# Patient Record
Sex: Female | Born: 1965 | Hispanic: Refuse to answer | Smoking: Current every day smoker
Health system: Southern US, Community
[De-identification: ages and names within clinical notes are randomized; demographics above are authoritative.]

---

## 2011-05-03 ENCOUNTER — Ambulatory Visit (INDEPENDENT_AMBULATORY_CARE_PROVIDER_SITE_OTHER): Payer: PRIVATE HEALTH INSURANCE

## 2011-05-03 DIAGNOSIS — F17200 Nicotine dependence, unspecified, uncomplicated: Secondary | ICD-10-CM

## 2011-05-03 DIAGNOSIS — E669 Obesity, unspecified: Secondary | ICD-10-CM

## 2011-05-03 DIAGNOSIS — M546 Pain in thoracic spine: Secondary | ICD-10-CM

## 2011-05-03 DIAGNOSIS — Z0000 Encounter for general adult medical examination without abnormal findings: Secondary | ICD-10-CM

## 2011-05-29 ENCOUNTER — Ambulatory Visit (INDEPENDENT_AMBULATORY_CARE_PROVIDER_SITE_OTHER): Payer: PRIVATE HEALTH INSURANCE | Admitting: Physician Assistant

## 2011-05-29 DIAGNOSIS — N912 Amenorrhea, unspecified: Secondary | ICD-10-CM

## 2011-05-29 DIAGNOSIS — F17200 Nicotine dependence, unspecified, uncomplicated: Secondary | ICD-10-CM

## 2011-05-29 DIAGNOSIS — R062 Wheezing: Secondary | ICD-10-CM

## 2011-05-29 DIAGNOSIS — R404 Transient alteration of awareness: Secondary | ICD-10-CM

## 2011-05-29 DIAGNOSIS — R5383 Other fatigue: Secondary | ICD-10-CM

## 2011-06-05 ENCOUNTER — Other Ambulatory Visit: Payer: Self-pay | Admitting: Otolaryngology

## 2011-06-05 DIAGNOSIS — J329 Chronic sinusitis, unspecified: Secondary | ICD-10-CM

## 2011-06-11 ENCOUNTER — Other Ambulatory Visit: Payer: Self-pay

## 2013-09-12 ENCOUNTER — Encounter (HOSPITAL_COMMUNITY): Payer: Self-pay | Admitting: Emergency Medicine

## 2013-09-12 DIAGNOSIS — F17200 Nicotine dependence, unspecified, uncomplicated: Secondary | ICD-10-CM | POA: Insufficient documentation

## 2013-09-12 DIAGNOSIS — Z7982 Long term (current) use of aspirin: Secondary | ICD-10-CM | POA: Diagnosis not present

## 2013-09-12 DIAGNOSIS — G44209 Tension-type headache, unspecified, not intractable: Secondary | ICD-10-CM | POA: Insufficient documentation

## 2013-09-12 DIAGNOSIS — Z791 Long term (current) use of non-steroidal anti-inflammatories (NSAID): Secondary | ICD-10-CM | POA: Insufficient documentation

## 2013-09-12 DIAGNOSIS — R51 Headache: Secondary | ICD-10-CM | POA: Diagnosis present

## 2013-09-12 MED ORDER — OXYCODONE-ACETAMINOPHEN 5-325 MG PO TABS
1.0000 | ORAL_TABLET | Freq: Once | ORAL | Status: AC
Start: 2013-09-12 — End: 2013-09-12
  Administered 2013-09-12: 1 via ORAL
  Filled 2013-09-12: qty 1

## 2013-09-12 NOTE — ED Notes (Addendum)
C/o headache x 3-4 days with nausea and light sensitivity.  Taking OTC meds without relief.  No neuro deficits noted on triage exam.

## 2013-09-13 ENCOUNTER — Emergency Department (HOSPITAL_COMMUNITY)
Admission: EM | Admit: 2013-09-13 | Discharge: 2013-09-13 | Disposition: A | Discharge status: Home / Self Care | Payer: 59 | Attending: Emergency Medicine | Admitting: Emergency Medicine

## 2013-09-13 DIAGNOSIS — G44209 Tension-type headache, unspecified, not intractable: Secondary | ICD-10-CM

## 2013-09-13 MED ORDER — OXYCODONE-ACETAMINOPHEN 5-325 MG PO TABS
1.0000 | ORAL_TABLET | Freq: Once | ORAL | Status: AC
  Administered 2013-09-13: 1 via ORAL
  Filled 2013-09-13: qty 1

## 2013-09-13 MED ORDER — SODIUM CHLORIDE 0.9 % IV SOLN
1000.0000 mL | INTRAVENOUS | Status: DC
  Administered 2013-09-13: 1000 mL via INTRAVENOUS

## 2013-09-13 MED ORDER — DEXAMETHASONE SODIUM PHOSPHATE 10 MG/ML IJ SOLN
10.0000 mg | Freq: Once | INTRAMUSCULAR | Status: AC
  Administered 2013-09-13: 10 mg via INTRAVENOUS
  Filled 2013-09-13: qty 1

## 2013-09-13 MED ORDER — METOCLOPRAMIDE HCL 5 MG/ML IJ SOLN
10.0000 mg | Freq: Once | INTRAMUSCULAR | Status: AC
  Administered 2013-09-13: 10 mg via INTRAVENOUS
  Filled 2013-09-13: qty 2

## 2013-09-13 MED ORDER — DIPHENHYDRAMINE HCL 50 MG/ML IJ SOLN
25.0000 mg | Freq: Once | INTRAMUSCULAR | Status: AC
  Administered 2013-09-13: 25 mg via INTRAVENOUS
  Filled 2013-09-13: qty 1

## 2013-09-13 MED ORDER — SODIUM CHLORIDE 0.9 % IV SOLN
1000.0000 mL | Freq: Once | INTRAVENOUS | Status: AC
  Administered 2013-09-13: 1000 mL via INTRAVENOUS

## 2013-09-13 MED ORDER — METOCLOPRAMIDE HCL 10 MG PO TABS: 10.0000 mg | ORAL_TABLET | Freq: Four times a day (QID) | ORAL | Status: AC | PRN

## 2013-09-13 NOTE — ED Notes (Signed)
Pt states headache for 3 days that is made worse with light.  Pt states OTC medicaitons are not helping "at all"

## 2013-09-13 NOTE — Discharge Instructions (Signed)
Tension Headache A tension headache is a feeling of pain, pressure, or aching often felt over the front and sides of the head. The pain can be dull or can feel tight (constricting). It is the most common type of headache. Tension headaches are not normally associated with nausea or vomiting and do not get worse with physical activity. Tension headaches can last 30 minutes to several days.  CAUSES  The exact cause is not known, but it may be caused by chemicals and hormones in the brain that lead to pain. Tension headaches often begin after stress, anxiety, or depression. Other triggers may include:  Alcohol.  Caffeine (too much or withdrawal).  Respiratory infections (colds, flu, sinus infections).  Dental problems or teeth clenching.  Fatigue.  Holding your head and neck in one position too long while using a computer. SYMPTOMS   Pressure around the head.   Dull, aching head pain.   Pain felt over the front and sides of the head.   Tenderness in the muscles of the head, neck, and shoulders. DIAGNOSIS  A tension headache is often diagnosed based on:   Symptoms.   Physical examination.   A CT scan or MRI of your head. These tests may be ordered if symptoms are severe or unusual. TREATMENT  Medicines may be given to help relieve symptoms.  HOME CARE INSTRUCTIONS   Only take over-the-counter or prescription medicines for pain or discomfort as directed by your caregiver.   Lie down in a dark, quiet room when you have a headache.   Keep a journal to find out what may be triggering your headaches. For example, write down:  What you eat and drink.  How much sleep you get.  Any change to your diet or medicines.  Try massage or other relaxation techniques.   Ice packs or heat applied to the head and neck can be used. Use these 3 to 4 times per day for 15 to 20 minutes each time, or as needed.   Limit stress.   Sit up straight, and do not tense your muscles.    Quit smoking if you smoke.  Limit alcohol use.  Decrease the amount of caffeine you drink, or stop drinking caffeine.  Eat and exercise regularly.  Get 7 to 9 hours of sleep, or as recommended by your caregiver.  Avoid excessive use of pain medicine as recurrent headaches can occur.  SEEK MEDICAL CARE IF:   You have problems with the medicines you were prescribed.  Your medicines do not work.  You have a change from the usual headache.  You have nausea or vomiting. SEEK IMMEDIATE MEDICAL CARE IF:   Your headache becomes severe.  You have a fever.  You have a stiff neck.  You have loss of vision.  You have muscular weakness or loss of muscle control.  You lose your balance or have trouble walking.  You feel faint or pass out.  You have severe symptoms that are different from your first symptoms. MAKE SURE YOU:   Understand these instructions.  Will watch your condition.  Will get help right away if you are not doing well or get worse. Document Released: 04/22/2005 Document Revised: 07/15/2011 Document Reviewed: 04/12/2011 [facility] Patient Information 2014 [place], [place].  Metoclopramide tablets What is this medicine? METOCLOPRAMIDE (met oh kloe PRA mide) is used to treat the symptoms of gastroesophageal reflux disease (GERD) like heartburn. It is also used to treat people with slow emptying of the stomach and intestinal tract.  This medicine may be used for other purposes; ask your health care provider or pharmacist if you have questions. COMMON BRAND NAME(S): Reglan What should I tell my health care provider before I take this medicine? They need to know if you have any of these conditions: -breast cancer -depression -diabetes -heart failure -high blood pressure -kidney disease -liver disease -Parkinson's disease or a movement disorder -pheochromocytoma -seizures -stomach obstruction, bleeding, or perforation -an unusual or allergic reaction to  metoclopramide, procainamide, sulfites, other medicines, foods, dyes, or preservatives -pregnant or trying to get pregnant -breast-feeding How should I use this medicine? Take this medicine by mouth with a glass of water. Follow the directions on the prescription label. Take this medicine on an empty stomach, about 30 minutes before eating. Take your doses at regular intervals. Do not take your medicine more often than directed. Do not stop taking except on the advice of your doctor or health care professional. A special MedGuide will be given to you by the pharmacist with each prescription and refill. Be sure to read this information carefully each time. Talk to your pediatrician regarding the use of this medicine in children. Special care may be needed. Overdosage: If you think you have taken too much of this medicine contact a poison control center or emergency room at once. NOTE: This medicine is only for you. Do not share this medicine with others. What if I miss a dose? If you miss a dose, take it as soon as you can. If it is almost time for your next dose, take only that dose. Do not take double or extra doses. What may interact with this medicine? -acetaminophen -cyclosporine -digoxin -medicines for blood pressure -medicines for diabetes, including insulin -medicines for hay fever and other allergies -medicines for depression, especially an Monoamine Oxidase Inhibitor (MAOI) -medicines for Parkinson's disease, like levodopa -medicines for sleep or for pain -tetracycline This list may not describe all possible interactions. Give your health care provider a list of all the medicines, herbs, non-prescription drugs, or dietary supplements you use. Also tell them if you smoke, drink alcohol, or use illegal drugs. Some items may interact with your medicine. What should I watch for while using this medicine? It may take a few weeks for your stomach condition to start to get better. However,  do not take this medicine for longer than 12 weeks. The longer you take this medicine, and the more you take it, the greater your chances are of developing serious side effects. If you are an elderly patient, a female patient, or you have diabetes, you may be at an increased risk for side effects from this medicine. Contact your doctor immediately if you start having movements you cannot control such as lip smacking, rapid movements of the tongue, involuntary or uncontrollable movements of the eyes, head, arms and legs, or muscle twitches and spasms. Patients and their families should watch out for worsening depression or thoughts of suicide. Also watch out for any sudden or severe changes in feelings such as feeling anxious, agitated, panicky, irritable, hostile, aggressive, impulsive, severely restless, overly excited and hyperactive, or not being able to sleep. If this happens, especially at the beginning of treatment or after a change in dose, call your doctor. Do not treat yourself for high fever. Ask your doctor or health care professional for advice. You may get drowsy or dizzy. Do not drive, use machinery, or do anything that needs mental alertness until you know how this drug affects you. Do not  stand or sit up quickly, especially if you are an older patient. This reduces the risk of dizzy or fainting spells. Alcohol can make you more drowsy and dizzy. Avoid alcoholic drinks. What side effects may I notice from receiving this medicine? Side effects that you should report to your doctor or health care professional as soon as possible: -allergic reactions like skin rash, itching or hives, swelling of the face, lips, or tongue -abnormal production of milk in females -breast enlargement in both males and females -change in the way you walk -difficulty moving, speaking or swallowing -drooling, lip smacking, or rapid movements of the tongue -excessive sweating -fever -involuntary or uncontrollable  movements of the eyes, head, arms and legs -irregular heartbeat or palpitations -muscle twitches and spasms -unusually weak or tired Side effects that usually do not require medical attention (report to your doctor or health care professional if they continue or are bothersome): -change in sex drive or performance -depressed mood -diarrhea -difficulty sleeping -headache -menstrual changes -restless or nervous This list may not describe all possible side effects. Call your doctor for medical advice about side effects. You may report side effects to FDA at 1-800-FDA-1088. Where should I keep my medicine? Keep out of the reach of children. Store at room temperature between 20 and 25 degrees C (68 and 77 degrees F). Protect from light. Keep container tightly closed. Throw away any unused medicine after the expiration date. NOTE: This sheet is a summary. It may not cover all possible information. If you have questions about this medicine, talk to your doctor, pharmacist, or health care provider.  2014, Elsevier/Gold Standard. (2011-08-20 13:04:38)

## 2013-09-13 NOTE — ED Provider Notes (Signed)
CSN: [id number]     Arrival date & time 09/12/13  2321 History   First MD Initiated Contact with Patient 09/13/13 0137     Chief Complaint  Patient presents with  . Headache     (Consider location/radiation/quality/duration/timing/severity/associated sxs/prior Treatment) Patient is a 48 y.o. female presenting with headaches. The history is provided by the patient.  Headache She complains of a headache for the last 3 days. It starts at the occiput and radiates to the vertex. She describes it as a throbbing and she rates it at 10/10. It is worse with exposure to light or sound. There is associated nausea but no vomiting. She denies visual disturbance. She denies fever or chills. There's no weakness or numbness. She's tried taking over-the-counter medications including ibuprofen, BC powder, Goody Powder with no relief. She was also given 2 doses of acetaminophen-oxycodone in the ED with no relief. She has not had headaches like this before. She denies fever or chills. Denies stiff neck. There's been no rash and she denies any recent tick bites.  History reviewed. No pertinent past medical history. History reviewed. No pertinent past surgical history. No family history on file. History  Substance Use Topics  . Smoking status: Current Every Day Smoker  . Smokeless tobacco: Not on file  . Alcohol Use: No   OB History   Grav Para Term Preterm Abortions TAB SAB Ect Mult Living                 Review of Systems  Neurological: Positive for headaches.  All other systems reviewed and are negative.     Allergies  Review of patient's allergies indicates no known allergies.  Home Medications   Prior to Admission medications   Medication Sig Start Date End Date Taking? Authorizing Provider  acetaminophen (TYLENOL) 500 MG tablet Take 1,000 mg by mouth every 6 (six) hours as needed for headache.   Yes Historical Provider, MD  Aspirin-Acetaminophen-Caffeine (GOODY HEADACHE PO) Take 1 packet  by mouth 2 (two) times daily as needed (for headache).   Yes Historical Provider, MD  Aspirin-Salicylamide-Caffeine (BC HEADACHE POWDER PO) Take 1 packet by mouth 2 (two) times daily as needed (for headache).   Yes Historical Provider, MD  ibuprofen (ADVIL,MOTRIN) 200 MG tablet Take 800 mg by mouth every 6 (six) hours as needed for headache.   Yes Historical Provider, MD   BP 120/68  Pulse 89  Temp(Src) 98.3 F (36.8 C) (Oral)  Resp 20  Ht 5' (1.524 m)  Wt 200 lb (90.719 kg)  BMI 39.06 kg/m2  SpO2 92% Physical Exam  Nursing note and vitals reviewed.  48 year old female, resting comfortably and in no acute distress. Vital signs are normal. Oxygen saturation is 92%, which is normal. Head is normocephalic and atraumatic. PERRLA, EOMI. Oropharynx is clear. Fundi show no hemorrhage, exudate, or papilledema. There is tenderness palpation over the temporalis muscles bilaterally as well as the insertion of the paracervical muscles bilaterally. Neck is nontender and supple without adenopathy or JVD. Back is nontender and there is no CVA tenderness. Lungs are clear without rales, wheezes, or rhonchi. Chest is nontender. Heart has regular rate and rhythm without murmur. Abdomen is soft, flat, nontender without masses or hepatosplenomegaly and peristalsis is normoactive. Extremities have no cyanosis or edema, full range of motion is present. Skin is warm and dry without rash. Neurologic: Mental status is normal, cranial nerves are intact, there are no motor or sensory deficits.  ED Course  Procedures (including  critical care time)  MDM   Final diagnoses:  Muscle contraction headache    Headache with pattern suggestive of muscle contraction headache. She'll be given IV fluids, IV metoclopramide, IV diphenhydramine and reassessed. She has no old records in the [place] medical system.  She had excellent relief of headache with above noted treatment. Skin a dose of intravenous  dexamethasone and is discharged with prescription for metoclopramide.  Dione Boozeavid Shamira Toutant, MD 09/13/13 0330

## 2017-09-13 ENCOUNTER — Emergency Department (HOSPITAL_COMMUNITY)
Admission: EM | Admit: 2017-09-13 | Discharge: 2017-09-14 | Disposition: A | Discharge status: Home / Self Care | Payer: 59 | Attending: Emergency Medicine | Admitting: Emergency Medicine

## 2017-09-13 ENCOUNTER — Emergency Department (HOSPITAL_COMMUNITY): Payer: 59

## 2017-09-13 ENCOUNTER — Encounter (HOSPITAL_COMMUNITY): Payer: Self-pay | Admitting: Emergency Medicine

## 2017-09-13 DIAGNOSIS — Z7982 Long term (current) use of aspirin: Secondary | ICD-10-CM | POA: Diagnosis not present

## 2017-09-13 DIAGNOSIS — Z79899 Other long term (current) drug therapy: Secondary | ICD-10-CM | POA: Diagnosis not present

## 2017-09-13 DIAGNOSIS — F17210 Nicotine dependence, cigarettes, uncomplicated: Secondary | ICD-10-CM | POA: Insufficient documentation

## 2017-09-13 DIAGNOSIS — M25561 Pain in right knee: Secondary | ICD-10-CM | POA: Diagnosis present

## 2017-09-13 NOTE — ED Triage Notes (Signed)
Patient presents to ED for 2 days of right knee pain.  Denies known injury.  Swelling noted.  Right knee is warm to touch.

## 2017-09-14 ENCOUNTER — Ambulatory Visit (HOSPITAL_BASED_OUTPATIENT_CLINIC_OR_DEPARTMENT_OTHER)
Admission: RE | Admit: 2017-09-14 | Discharge: 2017-09-14 | Disposition: A | Discharge status: Home / Self Care | Payer: 59 | Source: Ambulatory Visit | Attending: Emergency Medicine | Admitting: Emergency Medicine

## 2017-09-14 DIAGNOSIS — M25561 Pain in right knee: Secondary | ICD-10-CM

## 2017-09-14 DIAGNOSIS — M79661 Pain in right lower leg: Secondary | ICD-10-CM

## 2017-09-14 DIAGNOSIS — M79609 Pain in unspecified limb: Secondary | ICD-10-CM

## 2017-09-14 MED ORDER — OXYCODONE-ACETAMINOPHEN 5-325 MG PO TABS
1.0000 | ORAL_TABLET | ORAL | Status: DC | PRN
  Administered 2017-09-14: 1 via ORAL
  Filled 2017-09-14: qty 1

## 2017-09-14 NOTE — Progress Notes (Signed)
VASCULAR LAB PRELIMINARY  PRELIMINARY  PRELIMINARY  PRELIMINARY  Right lower extremity venous duplex completed.    Preliminary report:  There is no DVT, SVT, or Baker's cyst noted in the right lower extremity.   Michelle Dickerson, RVT 09/14/2017, 8:26 AM

## 2017-09-14 NOTE — ED Notes (Signed)
Dr. Nanavati at bedside 

## 2017-09-14 NOTE — ED Provider Notes (Signed)
[facility] EMERGENCY DEPARTMENT Provider Note   CSN: [id number] Arrival date & time: 09/13/17  2258     History   Chief Complaint Chief Complaint  Patient presents with  . Knee Pain    HPI Michelle Dickerson is a 52 y.o. female without significant PMHx, presenting to the ED with 2 days of right knee pain. Pain is located anteriorly and worse with ambulation. Reports assoc swelling. No injuries. Treated pain with ibuprofen. Denies N/T, fevers, or other complaints. No hx of DVT. Denies SOB or CP.  The history is provided by the patient.    History reviewed. No pertinent past medical history.  There are no active problems to display for this patient.   History reviewed. No pertinent surgical history.   OB History   None      Home Medications    Prior to Admission medications   Medication Sig Start Date End Date Taking? Authorizing Provider  acetaminophen (TYLENOL) 500 MG tablet Take 1,000 mg by mouth every 6 (six) hours as needed for headache.    [provider]  Aspirin-Acetaminophen-Caffeine (GOODY HEADACHE PO) Take 1 packet by mouth 2 (two) times daily as needed (for headache).    [provider]  Aspirin-Salicylamide-Caffeine (BC HEADACHE POWDER PO) Take 1 packet by mouth 2 (two) times daily as needed (for headache).    [provider]  ibuprofen (ADVIL,MOTRIN) 200 MG tablet Take 800 mg by mouth every 6 (six) hours as needed for headache.    [provider]  metoCLOPramide (REGLAN) 10 MG tablet Take 1 tablet (10 mg total) by mouth every 6 (six) hours as needed for nausea (or headache). 09/13/13   Dione Booze, MD    Family History History reviewed. No pertinent family history.  Social History Social History   Tobacco Use  . Smoking status: Current Every Day Smoker    Packs/day: 0.50  . Smokeless tobacco: Never Used  Substance Use Topics  . Alcohol use: No  . Drug use: No     Allergies   Patient has no  known allergies.   Review of Systems Review of Systems  Constitutional: Negative for chills and fever.  Musculoskeletal: Positive for arthralgias and joint swelling.  Skin: Negative for color change and wound.  Neurological: Negative for numbness.   Physical Exam Updated Vital Signs BP 135/79 (BP Location: Right Arm)   Pulse 84   Temp 98.8 F (37.1 C) (Oral)   Resp 16   SpO2 98%   Physical Exam  Constitutional: She appears well-developed and well-nourished. No distress.  HENT:  Head: Normocephalic and atraumatic.  Eyes: Conjunctivae are normal.  Cardiovascular: Normal rate and intact distal pulses.  Pulmonary/Chest: Effort normal.  Musculoskeletal:  Right knee with some swelling and warmth. No erythema. No wounds. Nl ROM though with pain. Knee is stable. Calf and swelling noted distal to knee. No tenderness to calf.  Psychiatric: She has a normal mood and affect. Her behavior is normal.  Nursing note and vitals reviewed.    ED Treatments / Results  Labs (all labs ordered are listed, but only abnormal results are displayed) Labs Reviewed - No data to display  EKG None  Radiology Dg Knee Complete 4 Views Right  Result Date: 09/13/2017 CLINICAL DATA:  General RIGHT knee pain for 2 days.  No injury. EXAM: RIGHT KNEE - COMPLETE 4+ VIEW COMPARISON:  None. FINDINGS: No fracture deformity or dislocation. Mild medial and lateral compartment joint space narrowing with periarticular spurring. No destructive  bony lesions. Moderate suprapatellar joint effusion. No subcutaneous gas or radiopaque foreign bodies. 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 Moderate suprapatellar joint effusion. Electronically Signed   By: Awilda Metro M.D.   On: 09/13/2017 23:50    Procedures Procedures (including critical care time)  Medications Ordered in ED Medications  oxyCODONE-acetaminophen (PERCOCET/ROXICET) 5-325 MG per tablet 1 tablet (1  tablet Oral Given 09/14/17 0057)     Initial Impression / Assessment and Plan / ED Course  I have reviewed the triage vital signs and the nursing notes.  Pertinent labs & imaging results that were available during my care of the patient were reviewed by me and considered in my medical decision making (see chart for details).     Pt presenting to the ED with 2 days of anterior right knee pain and swelling. Knee is warm with swelling, no erythema. Swelling noted to right calf and foot as well, though no calf tenderness on exam. Xray with bicompartmental osteoarthritis and joint effusion. Pt also evaluated by Dr. Rhunette Croft. Will discharge with symptomatic tx for arthritic findings, and outpatient order for DVT study, given exam findings. Pt without SOB, CP or concerning sx for PE. Strict return precautions discussed.  Discussed results, findings, treatment and follow up. Patient advised of return precautions. Patient verbalized understanding and agreed with plan.  Final Clinical Impressions(s) / ED Diagnoses   Final diagnoses:  Acute pain of right knee    ED Discharge Orders        Ordered    LE VENOUS     09/14/17 0450       Robinson, [place] N, PA-C 09/14/17 1610    Derwood Kaplan, MD 09/14/17 [id number]

## 2017-09-14 NOTE — Discharge Instructions (Addendum)
You can take  of ibuprofen every 6 hours as needed for pain. Apply ice for 20 minutes at a time. Elevate as much as possible.  IMPORTANT PATIENT INSTRUCTIONS:  You have been scheduled for an Outpatient Vascular Study at [facility].    If tomorrow is a Saturday, Sunday or holiday, please go to the [facility] Emergency Department Registration Desk at 8 am tomorrow morning and tell them you are there for a vascular study.  If tomorrow is a weekday (Monday-Friday), please go to [facility] Admitting Department at 8 am and tell them you are there for a vascular study.  Return to the ER sooner if you develop shortness of breath, chest pain, fever, or new or concerning symptoms.

## 2017-09-16 ENCOUNTER — Emergency Department (HOSPITAL_COMMUNITY)
Admission: EM | Admit: 2017-09-16 | Discharge: 2017-09-17 | Disposition: A | Discharge status: Home / Self Care | Payer: 59 | Attending: Emergency Medicine | Admitting: Emergency Medicine

## 2017-09-16 ENCOUNTER — Encounter (HOSPITAL_COMMUNITY): Payer: Self-pay | Admitting: Emergency Medicine

## 2017-09-16 ENCOUNTER — Other Ambulatory Visit: Payer: Self-pay

## 2017-09-16 DIAGNOSIS — F17210 Nicotine dependence, cigarettes, uncomplicated: Secondary | ICD-10-CM | POA: Insufficient documentation

## 2017-09-16 DIAGNOSIS — M25561 Pain in right knee: Secondary | ICD-10-CM | POA: Diagnosis not present

## 2017-09-16 DIAGNOSIS — Z7982 Long term (current) use of aspirin: Secondary | ICD-10-CM | POA: Insufficient documentation

## 2017-09-16 DIAGNOSIS — Z79899 Other long term (current) drug therapy: Secondary | ICD-10-CM | POA: Diagnosis not present

## 2017-09-16 MED ORDER — TRAMADOL HCL 50 MG PO TABS: 50.0000 mg | ORAL_TABLET | Freq: Two times a day (BID) | ORAL | 0 refills | Status: AC | PRN

## 2017-09-16 MED ORDER — OXYCODONE-ACETAMINOPHEN 5-325 MG PO TABS
1.0000 | ORAL_TABLET | ORAL | Status: DC | PRN
  Administered 2017-09-16: 1 via ORAL
  Filled 2017-09-16: qty 1

## 2017-09-16 MED ORDER — PREDNISONE 10 MG (21) PO TBPK: ORAL_TABLET | Freq: Every day | ORAL | 0 refills | Status: AC

## 2017-09-16 NOTE — ED Provider Notes (Signed)
[facility] EMERGENCY DEPARTMENT Provider Note   CSN: [id number] Arrival date & time: 09/16/17  2103     History   Chief Complaint Chief Complaint  Patient presents with  . Knee Pain    HPI Michelle Dickerson is a 52 y.o. female with no significant past medical history presents today for evaluation of acute onset, per aggressively worsening right knee pain for 5 days.  She was seen and evaluated on 09/13/2017 for  the same complaint and was found to be stable for discharge home at that time.  X-rays performed that date showed mild bicompartmental osteoarthritis.  There is also a moderate suprapatellar joint effusion.  She denies any known trauma or injury to the knee.  She states that pain is constant primarily localized to the anterior aspect of the right knee with some posterior knee pain.  Pain is sharp and aching at the same time.  Pain worsens with bending and prolonged ambulation.  She has been using a walker and elevating the knee with some relief and improvement in her swelling.  She does note mild warmth to the right knee.  She underwent DVT study the day after she was seen in the ED which was found to be negative.  She has been taking ibuprofen and took 1 Flexeril with some relief in her symptoms.  She denies numbness or tingling.  She denies fevers, nausea, vomiting, chest pain, or shortness of breath.  The history is provided by the patient.    History reviewed. No pertinent past medical history.  There are no active problems to display for this patient.   History reviewed. No pertinent surgical history.   OB History   None      Home Medications    Prior to Admission medications   Medication Sig Start Date End Date Taking? Authorizing Provider  acetaminophen (TYLENOL) 500 MG tablet Take 1,000 mg by mouth every 6 (six) hours as needed for headache.    [provider]  Aspirin-Acetaminophen-Caffeine (GOODY HEADACHE PO) Take 1 packet by mouth 2  (two) times daily as needed (for headache).    [provider]  Aspirin-Salicylamide-Caffeine (BC HEADACHE POWDER PO) Take 1 packet by mouth 2 (two) times daily as needed (for headache).    [provider]  ibuprofen (ADVIL,MOTRIN) 200 MG tablet Take 800 mg by mouth every 6 (six) hours as needed for headache.    [provider]  metoCLOPramide (REGLAN) 10 MG tablet Take 1 tablet (10 mg total) by mouth every 6 (six) hours as needed for nausea (or headache). 09/13/13   Dione Booze, MD  predniSONE (STERAPRED UNI-PAK 21 TAB) 10 MG (21) TBPK tablet Take by mouth daily. Take 6 tabs by mouth daily  for 2 days, then 5 tabs for 2 days, then 4 tabs for 2 days, then 3 tabs for 2 days, 2 tabs for 2 days, then 1 tab by mouth daily for 2 days 09/16/17   Michela Pitcher A, PA-C  traMADol (ULTRAM) 50 MG tablet Take 1 tablet (50 mg total) by mouth every 12 (twelve) hours as needed for severe pain. 09/16/17   Jeanie Sewer, PA-C    Family History No family history on file.  Social History Social History   Tobacco Use  . Smoking status: Current Every Day Smoker    Packs/day: 0.50  . Smokeless tobacco: Never Used  Substance Use Topics  . Alcohol use: No  . Drug use: No     Allergies  Patient has no known allergies.   Review of Systems Review of Systems  Constitutional: Negative for fever.  Gastrointestinal: Negative for nausea and vomiting.  Musculoskeletal: Positive for arthralgias and joint swelling.  Neurological: Negative for syncope, weakness and numbness.     Physical Exam Updated Vital Signs BP 113/74 (BP Location: Right Arm)   Pulse 84   Temp 98.1 F (36.7 C) (Oral)   Resp 16   Ht 5' (1.524 m)   Wt 86.2 kg (190 lb)   SpO2 97%   BMI 37.11 kg/m   Physical Exam  Constitutional: She is oriented to person, place, and time. She appears well-developed and well-nourished. No distress.  Mildly uncomfortable in appearance resting in bed  HENT:  Head:  Normocephalic and atraumatic.  Eyes: Conjunctivae are normal. Right eye exhibits no discharge. Left eye exhibits no discharge.  Neck: No JVD present. No tracheal deviation present.  Cardiovascular: Normal rate and intact distal pulses.  2+ DP/PT pulses bilaterally, no lower extremity edema, Homans sign absent bilaterally  Pulmonary/Chest: Effort normal.  Abdominal: She exhibits no distension.  Musculoskeletal: She exhibits no edema.  Slightly decreased range of motion of the right knee with flexion but overall normal range of motion.  There is mild swelling of the right knee anteriorly.  There is tenderness to palpation at the medial and lateral joint lines of the right knee.  No varus or valgus instability, negative anterior/posterior drawer test.  There is mild warmth of the right knee but no erythema.  5/5 strength of BLE major muscle groups.  Neurological: She is alert and oriented to person, place, and time. No sensory deficit. She exhibits normal muscle tone.  Fluent speech with no evidence of dysarthria or aphasia, no facial droop, sensation intact to soft touch of bilateral lower extremities.  Ambulates with an antalgic gait but is able to Heel Walk and Toe Walk without difficulty.  She exhibits good balance.  Skin: Skin is warm and dry. No erythema.  Psychiatric: She has a normal mood and affect. Her behavior is normal.  Nursing note and vitals reviewed.    ED Treatments / Results  Labs (all labs ordered are listed, but only abnormal results are displayed) Labs Reviewed - No data to display  EKG None  Radiology No results found.  Procedures Procedures (including critical care time)  Medications Ordered in ED Medications  oxyCODONE-acetaminophen (PERCOCET/ROXICET) 5-325 MG per tablet 1 tablet (1 tablet Oral Given 09/16/17 2201)     Initial Impression / Assessment and Plan / ED Course  I have reviewed the triage vital signs and the nursing notes.  Pertinent labs &  imaging results that were available during my care of the patient were reviewed by me and considered in my medical decision making (see chart for details).     Patient presents for ongoing right knee pain for 5 days.  She was seen and evaluated 3 days ago for similar complaints.  She is afebrile, vital signs are stable.  She is neurovascularly intact.  DVT study performed a few days ago was negative for acute DVT.  She ambulates with an antalgic gait but is able to bear weight on the right lower extremity.  Doubt occult tibial plateau fracture in the absence of trauma.  She exhibits knee effusion anteriorly and warmth but she does have good passive range of motion of the knee.  Given this and the duration of her symptoms and absence of constitutional symptoms I doubt septic joint.  Low suspicion  of gout although this is possible.  Doubt osteomyelitis. X-rays performed 3 days ago show osteoarthritis and her examination is consistent with osteoarthritis flare.  Will discharge with prednisone taper and tramadol for severe breakthrough pain.  She will establish care with a primary care physician or orthopedist in follow-up within 1 week for reevaluation.  Continue to encourage conservative treatment with the use of a walker and elevation and ice.  Discussed strict ED return precautions. Pt verbalized understanding of and agreement with plan and is safe for discharge home at this time.   Final Clinical Impressions(s) / ED Diagnoses   Final diagnoses:  Acute pain of right knee    ED Discharge Orders        Ordered    traMADol (ULTRAM) 50 MG tablet  Every 12 hours PRN     09/16/17 2357    predniSONE (STERAPRED UNI-PAK 21 TAB) 10 MG (21) TBPK tablet  Daily     09/16/17 2357       Jeanie Sewer, PA-C 09/17/17 0221    Doug Sou, MD 09/18/17 1719

## 2017-09-16 NOTE — Discharge Instructions (Addendum)
Start taking prednisone as prescribed.  Do not take ibuprofen, Advil, Aleve, or Motrin while on this medicine.  You may take 500 to 1000 mg of Tylenol every 6 hours to the prednisone.  When you are done with the prednisone, you may alternate 600 mg of ibuprofen and [phone] mg of Tylenol every 3 hours as needed for pain. Do not exceed 4000 mg of Tylenol daily.  Take ibuprofen with food to avoid upset stomach issues.  You may take tramadol as needed for severe pain but do not drive, drink alcohol, or operate heavy machinery while taking this medicine.  This medicine may make you drowsy.  Continue using your walker and avoid bearing weight on the extremity while you are in pain.  Elevate the extremity when you are not walking.  Apply ice or heat for 20 minutes at a time for comfort.  Call the number on the back of your insurance card to help set up an appointment with a primary care physician in the area for reevaluation.  You may also follow-up with an orthopedist for reevaluation.  Return to the emergency department immediately for any concerning signs or symptoms develop such as severe swelling, redness, or fevers.

## 2017-09-16 NOTE — ED Triage Notes (Signed)
Patient reports persistent right knee pain with mild swelling this week , denies injury , seen here 5/11 X- ray of knee shows arthritis , denies fever or chills .

## 2017-09-24 ENCOUNTER — Encounter (INDEPENDENT_AMBULATORY_CARE_PROVIDER_SITE_OTHER): Payer: Self-pay | Admitting: Orthopaedic Surgery

## 2017-09-24 ENCOUNTER — Ambulatory Visit (INDEPENDENT_AMBULATORY_CARE_PROVIDER_SITE_OTHER): Payer: 59 | Admitting: Orthopaedic Surgery

## 2017-09-24 DIAGNOSIS — M25561 Pain in right knee: Secondary | ICD-10-CM

## 2017-09-24 MED ORDER — LIDOCAINE HCL 1 % IJ SOLN
3.0000 mL | INTRAMUSCULAR | Status: AC | PRN
  Administered 2017-09-24: 3 mL

## 2017-09-24 MED ORDER — METHYLPREDNISOLONE ACETATE 40 MG/ML IJ SUSP
40.0000 mg | INTRAMUSCULAR | Status: AC | PRN
  Administered 2017-09-24: 40 mg via INTRA_ARTICULAR

## 2017-09-24 NOTE — Progress Notes (Signed)
Office Visit Note   Patient: Michelle Dickerson           Date of Birth: 1965-12-16           MRN: [id number] Visit Date: 09/24/2017              Requested by: No referring provider defined for this encounter. PCP: Patient, No Pcp Per   Assessment & Plan: Visit Diagnoses:  1. Acute pain of right knee     Plan: I talked her about trying a steroid injection in her knee.  I explained the risk and benefits of this and I think is going to help her the most.  I tried aspirating fluid off the knee but did not get anything off of it.  I did place a steroid injection without difficulty and she felt much better.  She says the ER did give her a steroid taper and has made her knee feel better as well.  All questions concerns were answered and addressed.  If she continues to have problems with her knee she will let us know.  I talked her about weight loss and quad strengthening exercises as well.  If she has recurrence in her effusion my next step would be to obtain an MRI.  Follow-Up Instructions: Return if symptoms worsen or fail to improve.   Orders:  No orders of the defined types were placed in this encounter.  No orders of the defined types were placed in this encounter.     Procedures: Large Joint Inj: R knee on 09/24/2017 9:51 AM Indications: diagnostic evaluation and pain Details: 22 G 1.5 in needle, superolateral approach  Arthrogram: No  Medications: 3 mL lidocaine 1 %; 40 mg methylPREDNISolone acetate 40 MG/ML Outcome: tolerated well, no immediate complications Procedure, treatment alternatives, risks and benefits explained, specific risks discussed. Consent was given by the patient. Immediately prior to procedure a time out was called to verify the correct patient, procedure, equipment, support staff and site/side marked as required. Patient was prepped and draped in the usual sterile fashion.       Clinical Data: No additional findings.   Subjective: Chief Complaint    Patient presents with  . Right Knee - Pain  The patient is a very pleasant 52 year old female who was referred from the emergency room due to acute right knee pain and swelling.  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  She went to the emergency room because she got where she could not put weight on her knee and the swelling came up all of a sudden.  She denies being a diabetic and denies any injury to her knee or any other recent illnesses.  She says she has put on a lot of weight since 2022/05/19 after her fianc's death.  HPI  Review of Systems She currently denies any headache, chest pain, shortness of breath, fever, chills, nausea, vomiting.  Objective: Vital Signs: There were no vitals taken for this visit.  Physical Exam She is alert and oriented x3 and in no acute distress Ortho Exam Examination of her right knee shows no obvious effusion at this point.  Her pain is mainly on the medial joint line with full range of motion.  The knee feels ligamentously stable.  Her Lachman's and McMurray's exams are normal Specialty Comments:  No specialty comments available.  Imaging: No results found. X-rays independently reviewed of the right knee from the emergency  room shows only mild arthritic changes but a moderate knee effusion.  There is otherwise no acute findings in terms of fracture.  PMFS History: There are no active problems to display for this patient.  History reviewed. No pertinent past medical history.  History reviewed. No pertinent family history.  History reviewed. No pertinent surgical history. Social History   Occupational History  . Not on file  Tobacco Use  . Smoking status: Current Every Day Smoker    Packs/day: 0.50  . Smokeless tobacco: Never Used  Substance and Sexual Activity  . Alcohol use: No  . Drug use: No  . Sexual activity: Not on file

## 2018-06-09 ENCOUNTER — Encounter (INDEPENDENT_AMBULATORY_CARE_PROVIDER_SITE_OTHER): Payer: Self-pay | Admitting: Orthopaedic Surgery

## 2018-06-09 ENCOUNTER — Other Ambulatory Visit (INDEPENDENT_AMBULATORY_CARE_PROVIDER_SITE_OTHER): Payer: Self-pay

## 2018-06-09 ENCOUNTER — Ambulatory Visit (INDEPENDENT_AMBULATORY_CARE_PROVIDER_SITE_OTHER): Payer: 59 | Admitting: Orthopaedic Surgery

## 2018-06-09 DIAGNOSIS — G8929 Other chronic pain: Secondary | ICD-10-CM

## 2018-06-09 DIAGNOSIS — M25561 Pain in right knee: Secondary | ICD-10-CM

## 2018-06-09 MED ORDER — METHYLPREDNISOLONE 4 MG PO TABS: ORAL_TABLET | ORAL | 0 refills | Status: AC

## 2018-06-09 MED ORDER — LIDOCAINE HCL 1 % IJ SOLN
3.0000 mL | INTRAMUSCULAR | Status: AC | PRN
  Administered 2018-06-09: 3 mL

## 2018-06-09 MED ORDER — HYDROCODONE-ACETAMINOPHEN 5-325 MG PO TABS: 1.0000 | ORAL_TABLET | Freq: Four times a day (QID) | ORAL | 0 refills | Status: AC | PRN

## 2018-06-09 MED ORDER — METHYLPREDNISOLONE ACETATE 40 MG/ML IJ SUSP
40.0000 mg | INTRAMUSCULAR | Status: AC | PRN
  Administered 2018-06-09: 40 mg via INTRA_ARTICULAR

## 2018-06-09 NOTE — Progress Notes (Signed)
Office Visit Note   Patient: Michelle Dickerson           Date of Birth: 1966/02/13           MRN: [id number] Visit Date: 06/09/2018              Requested by: No referring provider defined for this encounter. PCP: Patient, No Pcp Per   Assessment & Plan: Visit Diagnoses:  1. Chronic pain of right knee     Plan: I was able to aspirate fluid from the knee.  He had a dark color to it almost consistent with gout or inflammatory process.  Certainly cannot rule out infection except for she denies any fever and chills and is not severely painful but it is painful enough.  She does appear uncomfortable.  I would put her on a 6-day steroid taper as well and some hydrocodone for pain.  Also gave her a note to keep her out of work this week.  I would like to obtain an MRI of this right knee based on what worsening on clinical exam.  We will see her back in 2 weeks to go over this.  All question concerns were answered and addressed. Follow-Up Instructions: Return in about 2 weeks (around 06/23/2018).   Orders:  Orders Placed This Encounter  Procedures  . Large Joint Inj   Meds ordered this encounter  Medications  . HYDROcodone-acetaminophen (NORCO/VICODIN) 5-325 MG tablet    Sig: Take 1 tablet by mouth every 6 (six) hours as needed for moderate pain.    Dispense:  30 tablet    Refill:  0  . methylPREDNISolone (MEDROL) 4 MG tablet    Sig: Medrol dose pack. Take as instructed    Dispense:  21 tablet    Refill:  0      Procedures: Large Joint Inj: R knee on 06/09/2018 10:26 AM Indications: diagnostic evaluation and pain Details: 22 G 1.5 in needle, superolateral approach  Arthrogram: No  Medications: 3 mL lidocaine 1 %; 40 mg methylPREDNISolone acetate 40 MG/ML Outcome: tolerated well, no immediate complications Procedure, treatment alternatives, risks and benefits explained, specific risks discussed. Consent was given by the patient. Immediately prior to procedure a time out was called to  verify the correct patient, procedure, equipment, support staff and site/side marked as required. Patient was prepped and draped in the usual sterile fashion.       Clinical Data: No additional findings.   Subjective: Chief Complaint  Patient presents with  . Right Knee - Pain  Patient someone we saw in May of last year.  At that time she had mild arthritic changes in her knee with acute pain.  We placed a steroid injection in her right knee and she did well.  Recently though she is having a lot of problems with bending her knee and weightbearing as well.  She is having a hard time mobilizing and getting around.  She does have a knee brace at home.  She is frustrated that she is having to miss work because of her knee.  She has had no other acute change according to her medical status.  She is 53 years old.  She denies a history of gout and denies being a diabetic.  When we injected her knee last May she said and done well until just recently when she developed severe acute pain.  HPI  Review of Systems She currently denies any headache, chest pain, shortness of breath, fever, chills, nausea, vomiting.  Objective: Vital Signs: There were no vitals taken for this visit.  Physical Exam She is alert and orient x3 and in no acute distress but she appears significantly uncomfortable.  She is walking with a significant limp as well. Ortho Exam Examination of her right knee shows is warm.  Is not red.  He has a moderate effusion.  He has painful range of motion as well. Specialty Comments:  No specialty comments available.  Imaging: No results found.   PMFS History: There are no active problems to display for this patient.  History reviewed. No pertinent past medical history.  History reviewed. No pertinent family history.  History reviewed. No pertinent surgical history. Social History   Occupational History  . Not on file  Tobacco Use  . Smoking status: Current Every Day  Smoker    Packs/day: 0.50  . Smokeless tobacco: Never Used  Substance and Sexual Activity  . Alcohol use: No  . Drug use: No  . Sexual activity: Not on file

## 2018-06-22 ENCOUNTER — Ambulatory Visit
Admission: RE | Admit: 2018-06-22 | Discharge: 2018-06-22 | Disposition: A | Discharge status: Home / Self Care | Payer: 59 | Source: Ambulatory Visit | Attending: Orthopaedic Surgery | Admitting: Orthopaedic Surgery

## 2018-06-22 DIAGNOSIS — G8929 Other chronic pain: Secondary | ICD-10-CM

## 2018-06-22 DIAGNOSIS — M25561 Pain in right knee: Principal | ICD-10-CM

## 2018-06-24 ENCOUNTER — Encounter (INDEPENDENT_AMBULATORY_CARE_PROVIDER_SITE_OTHER): Payer: Self-pay | Admitting: Orthopaedic Surgery

## 2018-06-24 ENCOUNTER — Ambulatory Visit (INDEPENDENT_AMBULATORY_CARE_PROVIDER_SITE_OTHER): Payer: 59 | Admitting: Orthopaedic Surgery

## 2018-06-24 DIAGNOSIS — M1711 Unilateral primary osteoarthritis, right knee: Secondary | ICD-10-CM | POA: Diagnosis not present

## 2018-06-24 DIAGNOSIS — M25561 Pain in right knee: Secondary | ICD-10-CM | POA: Diagnosis not present

## 2018-06-24 NOTE — Progress Notes (Signed)
Office Visit Note   Patient: Michelle Dickerson           Date of Birth: [birth year]           MRN: [id number] Visit Date: 06/24/2018              Requested by: No referring provider defined for this encounter. PCP: Patient, No Pcp Per   Assessment & Plan: Visit Diagnoses:  1. Unilateral primary osteoarthritis, right knee   2. Acute pain of right knee     Plan: I did explain to the patient she does have significant arthritis in her right knee and I would not recommend an arthroscopic intervention because this would not help from that standpoint.  I did offer at least an aspiration today and a steroid injection in her knee which he tolerated well.  We will set her up of late for hyaluronic acid in the knee.  We had a long and thorough discussion about her knee.  Likely the only other treatment could be a knee replacement if it stays back for her.  We will see her back in roughly 4 weeks to place hyaluronic acid in her right knee.  Follow-Up Instructions: Return in about 4 weeks (around 07/22/2018).   Orders:  No orders of the defined types were placed in this encounter.  No orders of the defined types were placed in this encounter.     Procedures: No procedures performed   Clinical Data: No additional findings.   Subjective: Chief Complaint  Patient presents with  . Right Knee - Follow-up  Patient is following up after having an MRI of her right knee.  She is a very pleasant 53 year old lady well-known to me.  She does weigh close to 200 pounds and is very short individual.  She is had a lot of stress on the right knee but injections in the past have helped.  Once our last visit her plain films did not of appear severe in terms of joint space narrowing but with an effusion and locking catching was sent her for an MRI.  She comes for review this today.  She still having a hard time getting around on her knee is very painful to her.  She is working through this but is still swelling on  her as well.  HPI  Review of Systems She currently denies any headache, chest pain, shortness of breath, fever, chills, nausea, vomiting  Objective: Vital Signs: There were no vitals taken for this visit.  Physical Exam She is alert and orient x3 and in no acute distress Ortho Exam Examination of her right knee does still show a moderate effusion with swelling and pain throughout its arc of motion.  She has medial and lateral joint line tenderness as well which is quite significant.  The knee feels stable ligamentously but is very painful. Specialty Comments:  No specialty comments available.  Imaging: No results found. The MRI is reviewed with her and it does show quite extensive arthritis in the medial compartment of her knee with near bone-on-bone wear.  There is areas of full-thickness cartilage loss.  She has a lot of stress and subchondral edema in the bone.  There is stress at the patellofemoral joint as well as the proximal fibula where she is having a lot of pain as well.  There is likely a ganglion type cyst in the back of her knee as well the way the radiologist describes this.  The meniscus are intact with just  degenerative changes in the ACL PCL are intact.  PMFS History: There are no active problems to display for this patient.  History reviewed. No pertinent past medical history.  History reviewed. No pertinent family history.  History reviewed. No pertinent surgical history. Social History   Occupational History  . Not on file  Tobacco Use  . Smoking status: Current Every Day Smoker    Packs/day: 0.50  . Smokeless tobacco: Never Used  Substance and Sexual Activity  . Alcohol use: No  . Drug use: No  . Sexual activity: Not on file

## 2018-06-25 ENCOUNTER — Telehealth (INDEPENDENT_AMBULATORY_CARE_PROVIDER_SITE_OTHER): Payer: Self-pay

## 2018-06-25 NOTE — Telephone Encounter (Signed)
Right knee gel injection  

## 2018-06-30 NOTE — Telephone Encounter (Signed)
Noted  

## 2018-07-07 ENCOUNTER — Telehealth (INDEPENDENT_AMBULATORY_CARE_PROVIDER_SITE_OTHER): Payer: Self-pay

## 2018-07-07 NOTE — Telephone Encounter (Signed)
Submitted VOB for Durolane, right knee. 

## 2018-07-08 ENCOUNTER — Telehealth (INDEPENDENT_AMBULATORY_CARE_PROVIDER_SITE_OTHER): Payer: Self-pay

## 2018-07-08 NOTE — Telephone Encounter (Signed)
FYI

## 2018-07-08 NOTE — Telephone Encounter (Signed)
FYI-  Patient's insurance does not cover any gel injection.  Please advise.  Thank you.

## 2018-07-10 ENCOUNTER — Telehealth (INDEPENDENT_AMBULATORY_CARE_PROVIDER_SITE_OTHER): Payer: Self-pay | Admitting: Orthopaedic Surgery

## 2018-07-10 NOTE — Telephone Encounter (Signed)
Patient called needing Rx refilled (Hydrocodone) The number to contact patient is [phone]

## 2018-07-10 NOTE — Telephone Encounter (Signed)
Pt called in said she is in severe pain and needs to know what to do? Pt says ibuprofen will not her.  [phone]

## 2018-07-10 NOTE — Telephone Encounter (Signed)
Tried calling to advise. No answer. No VM to LM.  

## 2018-07-10 NOTE — Telephone Encounter (Signed)
IC advised.  

## 2018-07-10 NOTE — Telephone Encounter (Signed)
I'm still not going to start narcotics on anyone prior to any kind of surgery.  She unfortunately has a bad knee that the only treatment would be a knee replacement.

## 2018-07-10 NOTE — Telephone Encounter (Signed)
Please advise. I advised patient tylenol/ibuprofen. Any other suggestions?

## 2018-07-10 NOTE — Telephone Encounter (Signed)
Please advise 

## 2018-07-10 NOTE — Telephone Encounter (Signed)
No to hydrocodone.  I don't use this med to treat arthritis pain.

## 2018-07-20 ENCOUNTER — Telehealth (INDEPENDENT_AMBULATORY_CARE_PROVIDER_SITE_OTHER): Payer: Self-pay | Admitting: Orthopaedic Surgery

## 2018-07-20 NOTE — Telephone Encounter (Signed)
Returned call to patient left message to call back. 

## 2018-07-21 ENCOUNTER — Telehealth (INDEPENDENT_AMBULATORY_CARE_PROVIDER_SITE_OTHER): Payer: Self-pay | Admitting: Orthopaedic Surgery

## 2018-07-21 NOTE — Telephone Encounter (Signed)
Please advise 

## 2018-07-21 NOTE — Telephone Encounter (Signed)
Pt called asking if she can have some pain medication called in

## 2018-07-22 NOTE — Telephone Encounter (Signed)
Please see Dr. Eliberto Ivory note of no narcotics.

## 2018-07-22 NOTE — Telephone Encounter (Signed)
Patient aware of the below message  

## 2018-07-24 ENCOUNTER — Telehealth (INDEPENDENT_AMBULATORY_CARE_PROVIDER_SITE_OTHER): Payer: Self-pay | Admitting: Orthopaedic Surgery

## 2018-07-24 NOTE — Telephone Encounter (Signed)
LMOM for patient asking for her to let me know exactly how she needs that note to read

## 2018-07-24 NOTE — Telephone Encounter (Signed)
Pt called asking if she can have another note to only work 2 days and off 1 day until she returns on the 1st .

## 2018-07-27 ENCOUNTER — Encounter (INDEPENDENT_AMBULATORY_CARE_PROVIDER_SITE_OTHER): Payer: Self-pay

## 2018-08-04 ENCOUNTER — Telehealth (INDEPENDENT_AMBULATORY_CARE_PROVIDER_SITE_OTHER): Payer: Self-pay | Admitting: Radiology

## 2018-08-04 NOTE — Telephone Encounter (Signed)
Pre-screened patient answered no to all questions. Does have cough but is smoker, no change in usual every day cough.

## 2018-08-05 ENCOUNTER — Ambulatory Visit (INDEPENDENT_AMBULATORY_CARE_PROVIDER_SITE_OTHER): Payer: 59 | Admitting: Orthopaedic Surgery

## 2018-08-05 ENCOUNTER — Encounter (INDEPENDENT_AMBULATORY_CARE_PROVIDER_SITE_OTHER): Payer: Self-pay | Admitting: Orthopaedic Surgery

## 2018-08-05 ENCOUNTER — Other Ambulatory Visit: Payer: Self-pay

## 2018-08-05 DIAGNOSIS — M25561 Pain in right knee: Secondary | ICD-10-CM | POA: Diagnosis not present

## 2018-08-05 DIAGNOSIS — M1711 Unilateral primary osteoarthritis, right knee: Secondary | ICD-10-CM

## 2018-08-05 DIAGNOSIS — G8929 Other chronic pain: Secondary | ICD-10-CM | POA: Diagnosis not present

## 2018-08-05 MED ORDER — DICLOFENAC SODIUM 1 % TD GEL: 2.0000 g | Freq: Four times a day (QID) | TRANSDERMAL | 3 refills | Status: AC

## 2018-08-05 MED ORDER — LIDOCAINE HCL 1 % IJ SOLN
3.0000 mL | INTRAMUSCULAR | Status: AC | PRN
  Administered 2018-08-05: 3 mL

## 2018-08-05 MED ORDER — LIDOCAINE 5 % EX PTCH: 1.0000 | MEDICATED_PATCH | CUTANEOUS | 0 refills | Status: AC

## 2018-08-05 MED ORDER — METHYLPREDNISOLONE ACETATE 40 MG/ML IJ SUSP
40.0000 mg | INTRAMUSCULAR | Status: AC | PRN
  Administered 2018-08-05: 40 mg via INTRA_ARTICULAR

## 2018-08-05 NOTE — Progress Notes (Signed)
Office Visit Note   Patient: Michelle Dickerson           Date of Birth: Mar 25, 1966           MRN: [id number] Visit Date: 08/05/2018              Requested by: No referring provider defined for this encounter. PCP: Patient, No Pcp Per   Assessment & Plan: Visit Diagnoses:  1. Unilateral primary osteoarthritis, right knee   2. Chronic pain of right knee     Plan: I am certainly at a loss of what else we can try for her.  She does need a total knee arthroplasty but right now with a corona virus pandemic we are not allowed to do joint replacement surgery.  Although it is only been 2 months I did at least place 1 more steroid injection in her knee knowing that we cannot put one in for now released 4 months or more and she understands that.  I also counseled her about pain medications knowing that we cannot keep putting her on narcotics.  I sent in some lidocaine patches and Voltaren gel to her pharmacy.  All question concerns were answered and addressed.  Follow-Up Instructions: after the coronavirus pandemic  Orders:  No orders of the defined types were placed in this encounter.  Meds ordered this encounter  Medications  . lidocaine (LIDODERM) 5 %    Sig: Place 1 patch onto the skin daily. Remove & Discard patch within 12 hours or as directed by MD    Dispense:  30 patch    Refill:  0  . diclofenac sodium (VOLTAREN) 1 % GEL    Sig: Apply 2 g topically 4 (four) times daily.    Dispense:  100 g    Refill:  3      Procedures: Large Joint Inj: R knee on 08/05/2018 9:51 AM Indications: diagnostic evaluation and pain Details: 22 G 1.5 in needle, superolateral approach  Arthrogram: No  Medications: 3 mL lidocaine 1 %; 40 mg methylPREDNISolone acetate 40 MG/ML Outcome: tolerated well, no immediate complications Procedure, treatment alternatives, risks and benefits explained, specific risks discussed. Consent was given by the patient. Immediately prior to procedure a time out was called  to verify the correct patient, procedure, equipment, support staff and site/side marked as required. Patient was prepped and draped in the usual sterile fashion.       Clinical Data: No additional findings.   Subjective: Chief Complaint  Patient presents with  . Right Knee - Follow-up  The patient comes in today with still severe right knee pain.  She has known significant osteoarthritis of the medial compartment of her knee.  She is someone who is trying to stay off the knee but is now having severe worsening pain of that right knee.  She is still not using a cane when she walks around but she says she has 1.  She is requesting narcotic pain medications and she understands why we cannot keep giving these to her.  HPI  Review of Systems She currently denies any headache, chest pain, shortness of breath, fever, chills, nausea, vomiting  Objective: Vital Signs: There were no vitals taken for this visit.  Physical Exam She is alert and orient x3 and in no acute distress Ortho Exam Examination of her right knee shows varus malalignment.  There is a mild effusion.  There is medial lateral joint line tenderness and severe pain. Specialty Comments:  No specialty comments available.  Imaging: No results found.   PMFS History: There are no active problems to display for this patient.  History reviewed. No pertinent past medical history.  History reviewed. No pertinent family history.  History reviewed. No pertinent surgical history. Social History   Occupational History  . Not on file  Tobacco Use  . Smoking status: Current Every Day Smoker    Packs/day: 0.50  . Smokeless tobacco: Never Used  Substance and Sexual Activity  . Alcohol use: No  . Drug use: No  . Sexual activity: Not on file

## 2018-11-03 ENCOUNTER — Ambulatory Visit: Payer: 59 | Admitting: Orthopaedic Surgery

## 2018-11-09 ENCOUNTER — Other Ambulatory Visit: Payer: Self-pay

## 2018-11-09 ENCOUNTER — Ambulatory Visit (INDEPENDENT_AMBULATORY_CARE_PROVIDER_SITE_OTHER): Payer: 59 | Admitting: Orthopaedic Surgery

## 2018-11-09 DIAGNOSIS — M1711 Unilateral primary osteoarthritis, right knee: Secondary | ICD-10-CM | POA: Diagnosis not present

## 2018-11-09 DIAGNOSIS — M25561 Pain in right knee: Secondary | ICD-10-CM | POA: Diagnosis not present

## 2018-11-09 DIAGNOSIS — G8929 Other chronic pain: Secondary | ICD-10-CM

## 2018-11-09 NOTE — Progress Notes (Signed)
The patient is well-known to me.  She is a 53 year old female with debilitating arthritis involving her right knee.  She is sought conservative treatment for over a year now.  She is worked on weight loss, activity modification, quad strengthening exercises as well as walking with a rolling walker.  It is gotten to where her pain is daily and it is definitely affecting her mobility, her quality of life and her activities of daily living.  It is 10 out of 10.  She does experience right knee swelling.  She does have a history of psoriasis as well.  We have also tried steroid injections in her knee on the right side as well as hyaluronic acid.  At this point with a very conservative treatment she does wish to consider knee replacement surgery.  She is not a diabetic.  She has moderate obesity.  On examination today of her right knee there is a moderate knee effusion.  There is painful arc of motion of that knee.  She does have patches of psoriasis around both legs.  Her left knee exam is normal.  We had a long and thorough discussion about knee replacement surgery.  We went over previous x-rays and MRI of her right knee.  MRI showed severe osteoarthritis in all 3 compartments with full-thickness cartilage loss.  I showed her knee model and we discussed the risk and benefits of surgery.  We talked about her interoperative and postoperative course and what to expect.  She would like this set up for sometime in a month and a half from now.  All question concerns were answered and addressed.  Work on getting things scheduled for her.

## 2018-12-01 ENCOUNTER — Telehealth: Payer: Self-pay | Admitting: Orthopaedic Surgery

## 2018-12-01 NOTE — Telephone Encounter (Signed)
Patient called asked if Dr Ninfa Linden can write a letter and have it faxed to [employer] stating she has not worked in 2 months so that she can get her S/T disability benefits. The fax# [phone]   The number to contact patient is [phone]

## 2018-12-02 NOTE — Telephone Encounter (Signed)
It is okay to provide a letter stating that we have been treating her for severe knee pain due to significant arthritis of her knee.  We have recommended surgery for the knee with providing her with a total knee replacement.  She has been out of work due to the severity of her pain and her limited mobility.

## 2018-12-02 NOTE — Telephone Encounter (Signed)
[place] for me to do?

## 2018-12-03 NOTE — Telephone Encounter (Signed)
Faxed to provided fax number

## 2018-12-14 ENCOUNTER — Telehealth: Payer: Self-pay | Admitting: Orthopaedic Surgery

## 2018-12-14 NOTE — Telephone Encounter (Signed)
Received vm from pt checking on forms from [employer]. IC,lmvm stated that I checked if [employer] had received new forms and nothing was logged. Advised for her to have [employer] re send forms and also gave her number for [employer].

## 2019-01-05 DEATH — deceased

## 2019-09-21 IMAGING — MR MR KNEE*R* W/O CM
7 series · 38 of 40 positions shown · non-contrast
Comparison: Plain films right knee 09/13/2017.

CLINICAL DATA: Right knee pain for 2 months.  No known injury.

EXAM:
MRI OF THE RIGHT KNEE WITHOUT CONTRAST
TECHNIQUE: Multiplanar, multisequence MR imaging of the knee was performed. No
intravenous contrast was administered.

[Series 6: T2 fat-sat · axial · right · 4.0mm · 0.50mm/px · z∈[-81,+71]mm · 8 of 36 slices shown (1 of 4)]
[im 1/36]
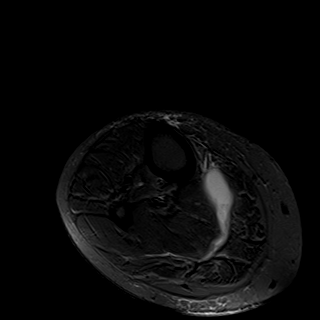
[im 6/36]
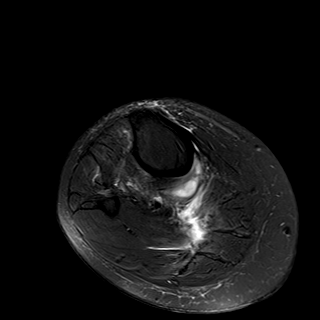
[im 11/36]
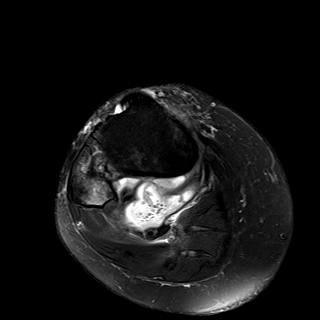
[im 16/36]
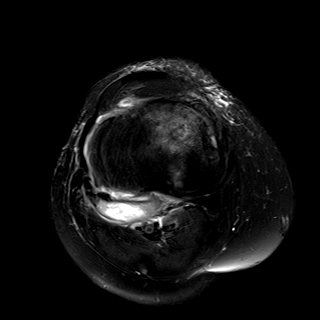
[im 21/36]
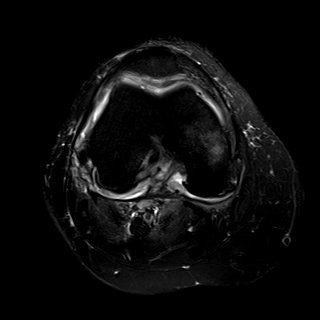
[im 26/36]
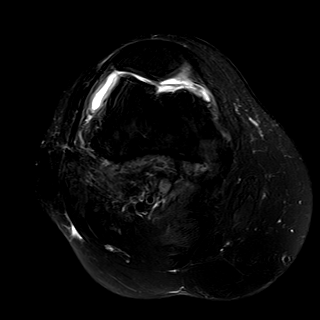
[im 31/36]
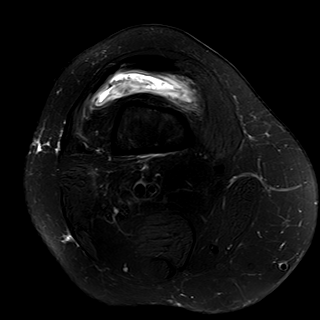
[im 36/36]
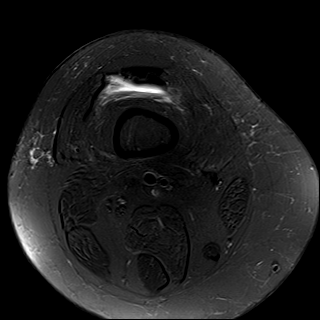

[Series 7: T2 fat-sat · coronal · right · 4.0mm · 0.39mm/px · 5 of 28 slices shown (2 of 4)]
[im 1/28]
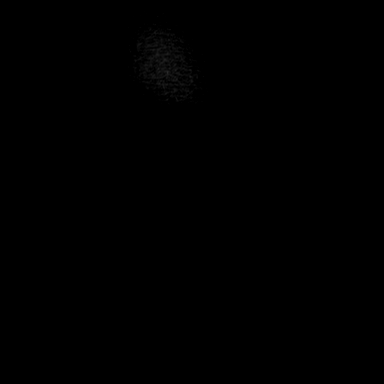
[im 7/28]
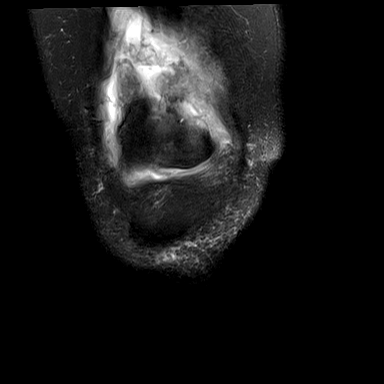
[im 14/28]
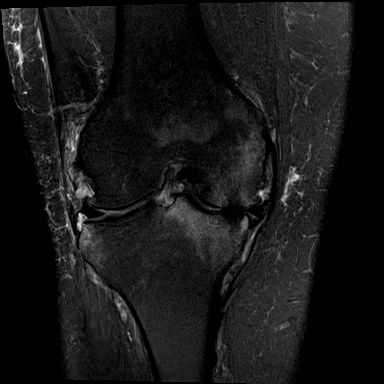
[im 21/28]
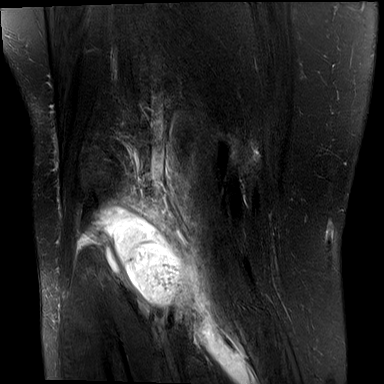
[im 28/28]
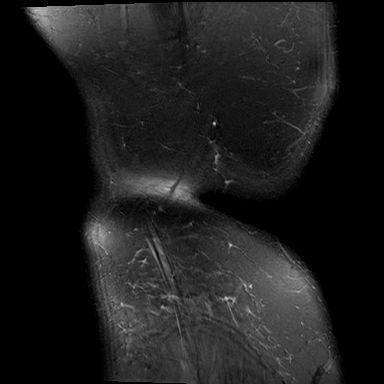

[Series 8: T1 · coronal · right · 4.0mm · 0.39mm/px · 3 of 28 slices shown]
[im 1/28]
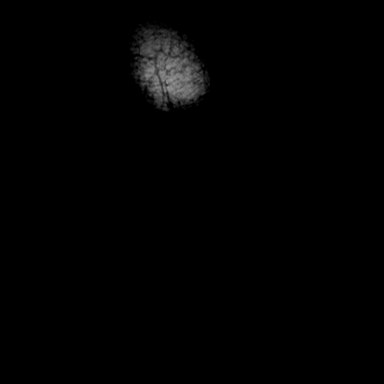
[im 7/28]
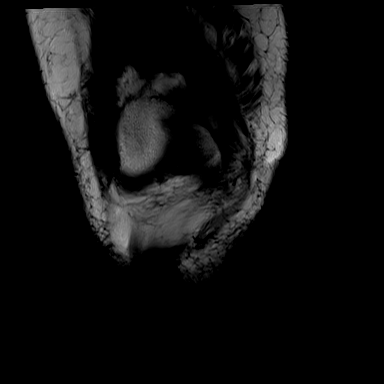
[im 14/28]
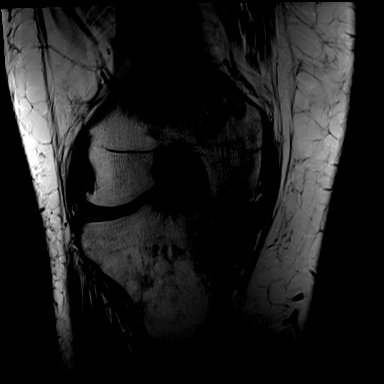

[Series 9: PD fat-sat · coronal · right · 3.0mm · 0.47mm/px · 5 of 28 slices shown (1 of 2)]
[im 1/28]
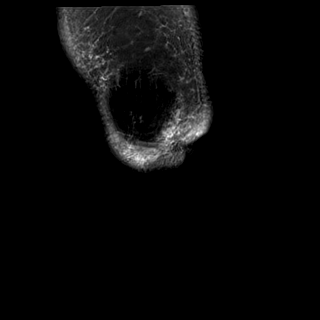
[im 7/28]
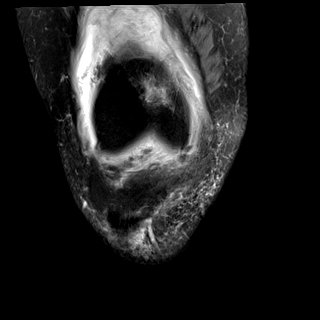
[im 14/28]
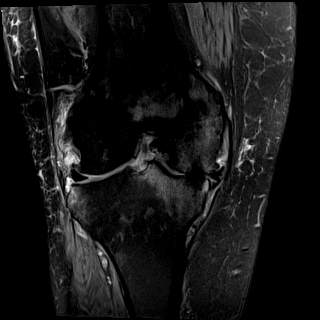
[im 21/28]
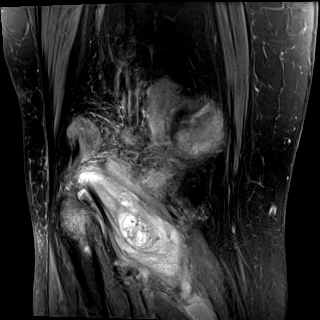
[im 28/28]
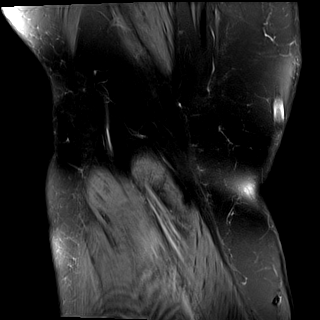

[Series 10: PD fat-sat · sagittal · right · 3.0mm · 0.39mm/px · 5 of 27 slices shown (2 of 2)]
[im 1/27]
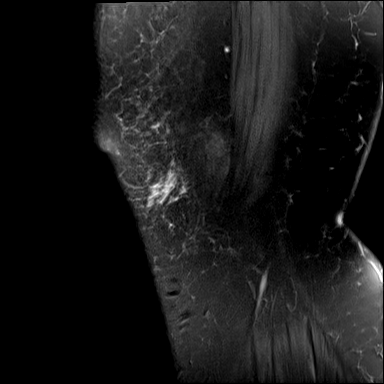
[im 7/27]
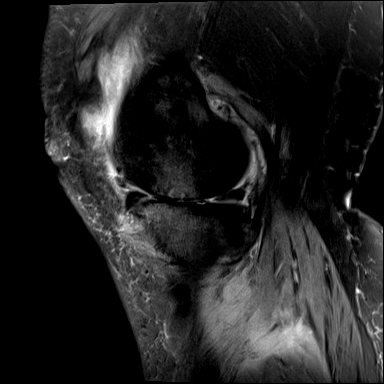
[im 14/27]
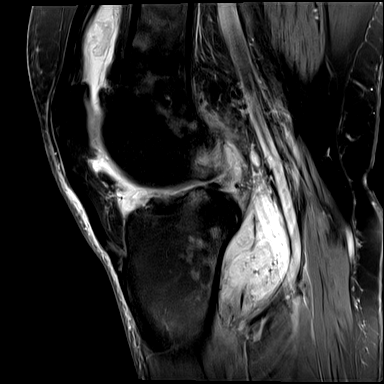
[im 20/27]
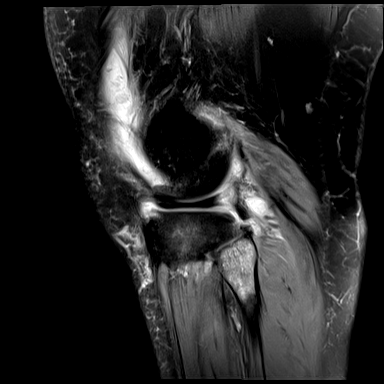
[im 27/27]
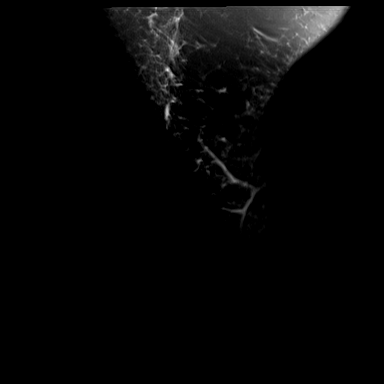

[Series 11: T2 fat-sat · sagittal · right · 3.0mm · 0.39mm/px · 5 of 27 slices shown (3 of 4)]
[im 1/27]
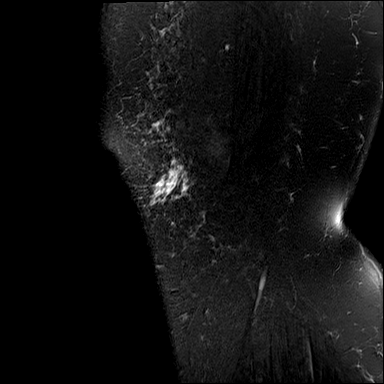
[im 7/27]
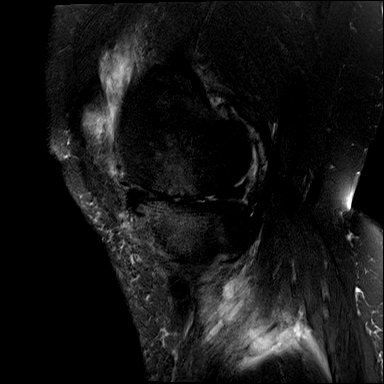
[im 14/27]
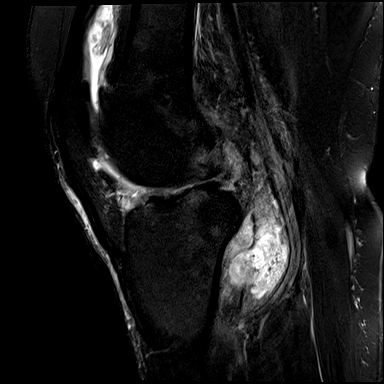
[im 20/27]
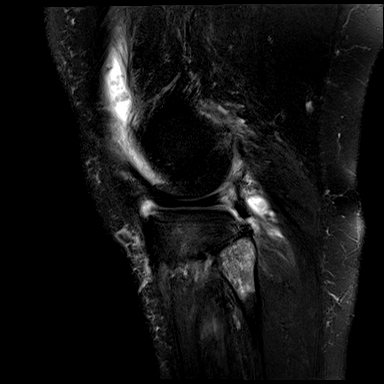
[im 27/27]
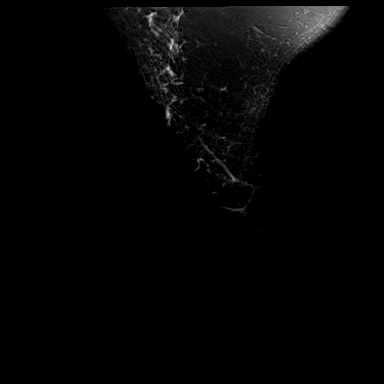

[Series 12: T2 fat-sat · axial · right · 4.0mm · 0.50mm/px · z∈[-81,+71]mm · 7 of 36 slices shown (4 of 4)]
[im 1/36]
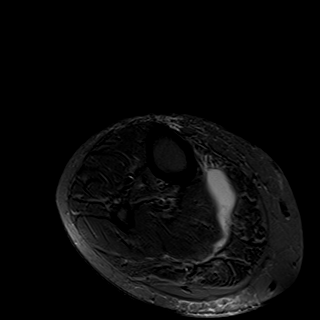
[im 6/36]
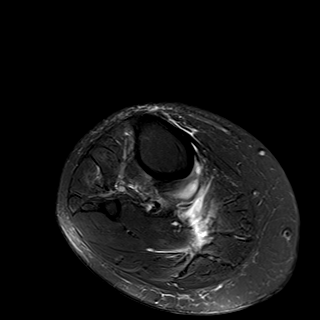
[im 12/36]
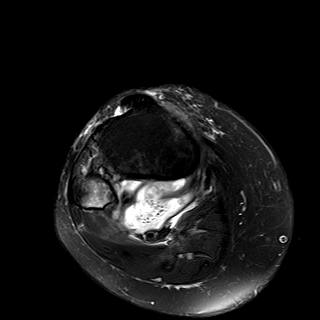
[im 18/36]
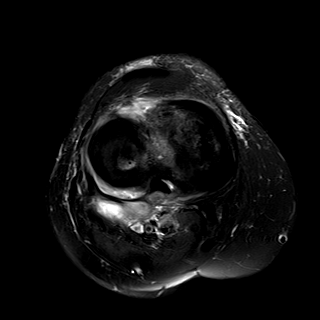
[im 24/36]
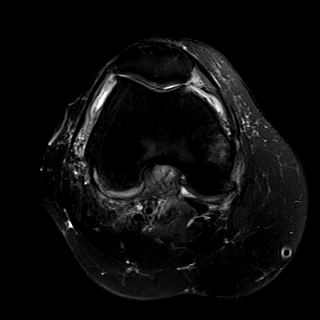
[im 30/36]
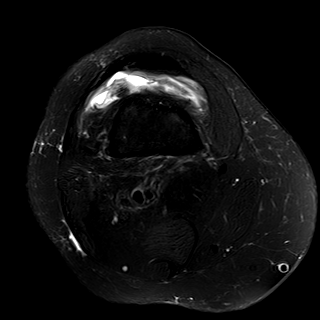
[im 36/36]
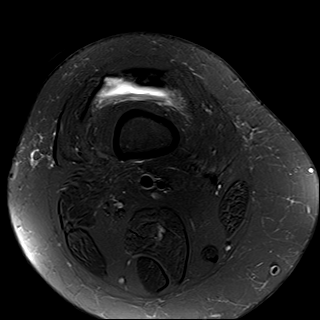

[38 of 40 positions shown; findings below may reference images not displayed]

FINDINGS: MENISCI

Medial meniscus: Degenerative signal is seen in the posterior horn
and body. The body is diminutive and extruded peripherally. No focal
tear is identified.

Lateral meniscus:  Intact.

LIGAMENTS

Cruciates:  Intact.

Collaterals:  Intact.

CARTILAGE

Patellofemoral: Fissuring and irregularity of cartilage are worst at
the patellar apex.

Medial: Severely degenerated with secondary bone-on-bone joint space
narrowing.

Lateral:  Thinned and irregular without focal defect.

Joint: Small joint effusion contains some debris and there is
synovial thickening.

Popliteal Fossa: The patient has a septated cystic lesion containing
debris in the popliteus. Discrete measurement is not possible but
the collection measures at least 2.5 cm AP by 4 cm transverse by 9
cm craniocaudal. The collection extends into the soft tissues of the
lower leg between the soleus and medial gastrocnemius. The popliteus
is intact.

Extensor Mechanism:  Intact.

Bones: Marked subchondral edema is present about the medial
compartment. There is also edema in the head and neck of the fibula
and less extensive edema in the posterior, lateral corner of the
tibia. Tricompartmental osteophytosis is present. No fracture.

Other: None.
IMPRESSION: Tricompartmental osteoarthritis is severe in the medial compartment
where there is bone-on-bone joint space narrowing and extensive
subchondral edema.

Negative for meniscal or ligament tear. The medial meniscus is
degenerated.

Complex fluid collection along the popliteus muscle belly is most
likely a large, chronic ganglion cyst rather than an abscess.

Marrow edema in the posterior, lateral tibia and fibular head and
neck is likely related to proximal tib-fib osteoarthritis. Edema is
more extensive in the fibula and could represent reactive change
related to the patient's large ganglion cyst. Osteomyelitis is
possible but thought unlikely.

Synovitis about the knee.
